# Patient Record
Sex: Male | Born: 2005 | Race: Black or African American | Hispanic: No | Marital: Single | Smoking: Never smoker
Health system: Southern US, Community
[De-identification: ages and names within clinical notes are randomized; demographics above are authoritative.]

---
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
## 2023-03-04 ENCOUNTER — Other Ambulatory Visit (HOSPITAL_COMMUNITY)
Admission: RE | Admit: 2023-03-04 | Discharge: 2023-03-04 | Disposition: A | Discharge status: Home / Self Care | Payer: Medicaid Other | Source: Ambulatory Visit | Attending: Pediatrics | Admitting: Pediatrics

## 2023-03-04 ENCOUNTER — Ambulatory Visit (INDEPENDENT_AMBULATORY_CARE_PROVIDER_SITE_OTHER): Payer: Medicaid Other | Admitting: Pediatrics

## 2023-03-04 ENCOUNTER — Encounter: Payer: Self-pay | Admitting: Pediatrics

## 2023-03-04 VITALS — BP 116/78 | HR 86 | Ht 66.73 in | Wt 130.1 lb

## 2023-03-04 DIAGNOSIS — Z1339 Encounter for screening examination for other mental health and behavioral disorders: Secondary | ICD-10-CM | POA: Diagnosis not present

## 2023-03-04 DIAGNOSIS — Z1331 Encounter for screening for depression: Secondary | ICD-10-CM | POA: Diagnosis not present

## 2023-03-04 DIAGNOSIS — Z0000 Encounter for general adult medical examination without abnormal findings: Secondary | ICD-10-CM

## 2023-03-04 DIAGNOSIS — Z114 Encounter for screening for human immunodeficiency virus [HIV]: Secondary | ICD-10-CM | POA: Diagnosis not present

## 2023-03-04 DIAGNOSIS — Z68.41 Body mass index (BMI) pediatric, 5th percentile to less than 85th percentile for age: Secondary | ICD-10-CM

## 2023-03-04 DIAGNOSIS — Z113 Encounter for screening for infections with a predominantly sexual mode of transmission: Secondary | ICD-10-CM | POA: Insufficient documentation

## 2023-03-04 LAB — POCT RAPID HIV: Rapid HIV, POC: NEGATIVE

## 2023-03-04 NOTE — Progress Notes (Signed)
Adolescent Well Care Visit Anthony Oneal is a 17 y.o. male who is here for well care.    PCP:  Maree Erie, MD   History was provided by the patient.  Confidentiality was discussed with the patient and, if applicable, with caregiver as well. Patient's personal or confidential phone number: [phone]   Current Issues: Current concerns include congested nose recently but now okay.  No major ills.  No meds.   Nutrition: Nutrition/Eating Behaviors: eating a variety - eating at home with mom now but can cook some things Adequate calcium in diet?: drinks milk (prefers chocolate) Supplements/ Vitamins: none  Exercise/ Media: Play any Sports?/ Exercise: walks at work (details cars  prn and works at [employer] 11 pm to 7 am x 4 days/week) Screen Time:  3 or 4 hours but broken up  [employer] or Monitoring?: N/A for adult patient  Sleep:  Sleep: 8 am to 4 pm  Social Screening: Lives with:  mom, brother Parental relations:  good Activities, Work, and Regulatory affairs officer?: works as noted above; can do his own laundry Concerns regarding behavior with peers?  no Stressors of note: no specific stressors stated but he has a very busy schedule  Education: School Name: [employer] Grade: sophomore year School performance: doing well; no concerns.  Studying for Psychologist, counselling School Behavior: doing well; no concerns Lived on campus first year and now is living at home, commuting when needed but classes mainly online. Plan to move into apartment with friends - 3 will rent a house [address] split between them.  He states excitement over this. Has transportation   Confidential Social History: Tobacco?  no Secondhand smoke exposure?  no Drugs/ETOH?  no  Sexually Active? Yes with last encounter  2 days ago with same partner x 2 months Pregnancy Prevention: sometimes condoms  Safe at home, in school & in relationships?  Yes Safe to self?  Yes    Screenings: Patient has a dental home: yes - last visit 1 month ago and braces off 3 months; has retainer  The patient completed the Rapid Assessment for Adolescent Preventive Services screening questionnaire and the following topics were identified as risk factors and discussed:  safety equipment, sexual contacts   In addition, the following topics were discussed as part of anticipatory guidance condom use and birth control.  PHQ-9 completed and results indicated low risk with score of 3; no self-harm ideation. Flowsheet Row Office Visit from 03/04/2023 in [facility]  PHQ-2 Total Score 1       Physical Exam:  Vitals:   03/04/23 1353  BP: 116/78  Pulse: 86  SpO2: 96%  Weight: 130 lb 2 oz (59 kg)  Height: 5' 6.73" (1.695 m)   BP 116/78 (BP Location: Left Arm, Patient Position: Sitting, Cuff Size: Small)   Pulse 86   Ht 5' 6.73" (1.695 m)   Wt 130 lb 2 oz (59 kg)   SpO2 96%   BMI 20.54 kg/m  Body mass index: body mass index is 20.54 kg/m. Blood pressure %iles are not available for patients who are 18 years or older.  Hearing Screening   500Hz  1000Hz  2000Hz  4000Hz   Right ear 20 20 20 20   Left ear 20 20 20 20    Vision Screening   Right eye Left eye Both eyes  Without correction 20/16 20/16 20/16   With correction       General Appearance:  alert, oriented, no acute distress  HENT: Normocephalic, no obvious abnormality, conjunctiva clear  Mouth:   Normal appearing teeth, no obvious discoloration, dental caries, or dental caps  Neck:   Supple; thyroid: no enlargement, symmetric, no tenderness/mass/nodules  Chest Normal male  Lungs:   Clear to auscultation bilaterally, normal work of breathing  Heart:   Regular rate and rhythm, S1 and S2 normal, no murmurs;   Abdomen:   Soft, non-tender, no mass, or organomegaly  GU genitalia not examined  Musculoskeletal:   Tone and strength strong and symmetrical, all extremities                Lymphatic:   No cervical adenopathy  Skin/Hair/Nails:   Skin warm, dry and intact, no rashes, no bruises or petechiae  Neurologic:   Strength, gait, and coordination normal and age-appropriate   Results for orders placed or performed in visit on 03/04/23 (from the past 48 hour(s))  POCT Rapid HIV     Status: Normal   Collection Time: 03/04/23  2:19 PM  Result Value Ref Range   Rapid HIV, POC Negative      Assessment and Plan:   1. Encounter for general adult medical examination without abnormal findings Hearing screening result:normal Vision screening result: normal Provided age appropriate guidance.   Discussed sleep, nutrition and his busy schedule. Encouraged flu vaccine at pharmacy of other medical site ([facility]); he is overage for our office vaccines. Ugo stated no interest in Covid vaccine but plan to consider flu vaccine.  2. Body mass index (BMI) of 5th to less than 85th percentile for age in patient less than 53 years of age BMI is appropriate for age; reviewed with patient and encouraged continued healthy lifestyle habits.  3. Screening examination for venereal disease Discussed use of condoms consistently.  He is not symptomatic of any illness. Will release results to MyChart and contact pt if treatment needed or other issues. Advise repeat screen annually and prn. - Urine cytology ancillary only  4. Screening for human immunodeficiency virus Results negative today; repeat as needed. - POCT Rapid HIV   Discussed transition to adult care with goal of being established with adult care before bithday. Markes stated understanding.  Maree Erie, MD

## 2023-03-04 NOTE — Patient Instructions (Addendum)
You are doing great and had a healthy check up today! Continue the good work with ideal 3 meals daily + snacks when needed.  8 hours sleep each night and daily exercise.  Please consider seasonal Flu vaccine and Covid vaccine. You are overage for vaccines at this office; however, you can get either or both of these vaccines at your [facility], health department or other preferred pharmacy  Add a multivitamin like One A Day Men's for enough calcium, vitamin D, iron and other nutrients in your diet. Either name brand or generic is fine and here are some examples:   You will need to establish care with an adult medicine provider - this means Family Medicine or Internal Medicine. You can go to [email or web address] and enter find a doctor if your family does not already have a preferred provider.  I urge you to do this soon; you have to be established with the new office for them to see you when you are not well.

## 2023-03-05 LAB — URINE CYTOLOGY ANCILLARY ONLY
Chlamydia: NEGATIVE
Comment: NEGATIVE
Comment: NORMAL
Neisseria Gonorrhea: NEGATIVE
